# Patient Record
Sex: Female | Born: 1965 | Race: Black or African American | Hispanic: No | Marital: Married | Smoking: Never smoker
Health system: Southern US, Community
[De-identification: ages and names within clinical notes are randomized; demographics above are authoritative.]

## PROBLEM LIST (undated history)

## (undated) DIAGNOSIS — D219 Benign neoplasm of connective and other soft tissue, unspecified: Secondary | ICD-10-CM

## (undated) HISTORY — DX: Benign neoplasm of connective and other soft tissue, unspecified: D21.9

## (undated) HISTORY — DX: Morbid (severe) obesity due to excess calories: E66.01

## (undated) HISTORY — PX: OVARIAN CYST REMOVAL: SHX89

---

## 2008-08-28 ENCOUNTER — Emergency Department (HOSPITAL_COMMUNITY): Admission: EM | Admit: 2008-08-28 | Discharge: 2008-08-29 | Payer: Self-pay | Admitting: Emergency Medicine

## 2016-04-30 ENCOUNTER — Emergency Department
Admission: EM | Admit: 2016-04-30 | Discharge: 2016-04-30 | Disposition: A | Discharge status: Home / Self Care | Payer: 59 | Source: Home / Self Care | Attending: Emergency Medicine | Admitting: Emergency Medicine

## 2016-04-30 ENCOUNTER — Emergency Department (INDEPENDENT_AMBULATORY_CARE_PROVIDER_SITE_OTHER): Payer: 59

## 2016-04-30 ENCOUNTER — Encounter: Payer: Self-pay | Admitting: *Deleted

## 2016-04-30 DIAGNOSIS — S8002XA Contusion of left knee, initial encounter: Secondary | ICD-10-CM

## 2016-04-30 DIAGNOSIS — M1712 Unilateral primary osteoarthritis, left knee: Secondary | ICD-10-CM | POA: Diagnosis not present

## 2016-04-30 MED ORDER — IBUPROFEN 600 MG PO TABS
600.0000 mg | ORAL_TABLET | Freq: Once | ORAL | Status: AC
  Administered 2016-04-30: 600 mg via ORAL

## 2016-04-30 MED ORDER — HYDROCODONE-ACETAMINOPHEN 5-325 MG PO TABS: 1.0000 | ORAL_TABLET | ORAL | 0 refills | Status: DC | PRN

## 2016-04-30 NOTE — ED Triage Notes (Signed)
Pt c/o LT knee pain post fall down her stairs at home at 0530 today. No OTC meds.

## 2016-05-02 NOTE — ED Provider Notes (Signed)
[facility]    CSN: [id number] Arrival date & time: 04/30/16  1850     History   Chief Complaint Chief Complaint  Patient presents with  . Knee Injury    HPI Alexis Martin is a 51 y.o. female.   HPI Pt c/o LT knee pain post fall down her stairs at home at 0530 today.  Has not tried any OTC meds. Severe left knee pain diffusely without radiation. Pain is sharp and dull. No associated numbness or weakness. + associated swelling, medial L knee. Pain is worse with movement or when trying to weight-bear. She is able to weight-bear, but with pain. History reviewed. No pertinent past medical history.  There are no active problems to display for this patient.   Past Surgical History:  Procedure Laterality Date  . CESAREAN SECTION    . OVARIAN CYST REMOVAL      OB History    No data available       Home Medications    Prior to Admission medications   Medication Sig Start Date End Date Taking? Authorizing Provider  HYDROcodone-acetaminophen (NORCO/VICODIN) 5-325 MG tablet Take 1-2 tablets by mouth every 4 (four) hours as needed for severe pain. Take with food. 04/30/16   Jacqulyn Cane, MD    Family History History reviewed. No pertinent family history.  Social History Social History  Substance Use Topics  . Smoking status: Never Smoker  . Smokeless tobacco: Never Used  . Alcohol use No     Allergies   Patient has no known allergies.   Review of Systems Review of Systems  All other systems reviewed and are negative.    Physical Exam Triage Vital Signs ED Triage Vitals  Enc Vitals Group     BP 04/30/16 1912 (!) 162/97     Pulse Rate 04/30/16 1912 71     Resp 04/30/16 1912 16     Temp 04/30/16 1912 98.6 F (37 C)     Temp Source 04/30/16 1912 Oral     SpO2 04/30/16 1912 100 %     Weight --      Height --      Head Circumference --      Peak Flow --      Pain Score 04/30/16 1913 10     Pain Loc --      Pain Edu? --      Excl. in  [place]? --    No data found.   Updated Vital Signs BP (!) 162/97 (BP Location: Left Arm)   Pulse 71   Temp 98.6 F (37 C) (Oral)   Resp 16   LMP 04/25/2016   SpO2 100%   Visual Acuity Right Eye Distance:   Left Eye Distance:   Bilateral Distance:    Right Eye Near:   Left Eye Near:    Bilateral Near:     Physical Exam  Constitutional: She is oriented to person, place, and time. She appears well-developed and well-nourished. No distress.  Very uncomfortable from left knee pain. No cardiorespiratory distress  HENT:  Head: Normocephalic and atraumatic.  Eyes: Pupils are equal, round, and reactive to light. No scleral icterus.  Neck: Normal range of motion. Neck supple.  Cardiovascular: Normal rate and regular rhythm.   Pulmonary/Chest: Effort normal.  Abdominal: She exhibits no distension.  Musculoskeletal:       Left knee: She exhibits decreased range of motion, swelling (Mild, medial aspect. She is most tender here) and bony tenderness. She exhibits no  effusion, no laceration, normal meniscus and no MCL laxity. Tenderness found. Medial joint line tenderness noted. No lateral joint line and no patellar tendon tenderness noted.       Left lower leg: Normal. She exhibits no tenderness, no swelling and no edema.  Neurovascular distally left lower extremity is intact  Neurological: She is alert and oriented to person, place, and time. No cranial nerve deficit.  Skin: Skin is warm and dry.  Psychiatric: She has a normal mood and affect. Her behavior is normal.  Vitals reviewed.     UC Treatments / Results  Labs (all labs ordered are listed, but only abnormal results are displayed) Labs Reviewed - No data to display  EKG  EKG Interpretation None       Radiology Dg Knee Complete 4 Views Left  Result Date: 04/30/2016 CLINICAL DATA:  Fall coming down stairs this morning injuring medial left knee. EXAM: LEFT KNEE - COMPLETE 4+ VIEW COMPARISON:  None. FINDINGS: Mild  tricompartmental osteoarthritic change. No evidence of acute fracture or dislocation. No significant joint effusion. IMPRESSION: No acute fracture. Mild tricompartmental osteoarthritis. Electronically Signed   By: Marin Olp M.D.   On: 04/30/2016 19:43    Procedures Procedures (including critical care time)  Medications Ordered in UC Medications  ibuprofen (ADVIL,MOTRIN) tablet 600 mg (600 mg Oral Given 04/30/16 1928)     Initial Impression / Assessment and Plan / UC Course  I have reviewed the triage vital signs and the nursing notes.  Pertinent labs & imaging results that were available during my care of the patient were reviewed by me and considered in my medical decision making (see chart for details).      Final Clinical Impressions(s) / UC Diagnoses   Final diagnoses:  Contusion of left knee, initial encounter  Reviewed with patient, left knee x-ray shows no fracture or dislocation or any significant joint effusion. Explained there is some mild osteoarthritis left knee.  Treatment options discussed, as well as risks, benefits, alternatives. Patient voiced understanding and agreement with the following plans: Encourage rest, ice, compression with ACE bandage, and elevation of injured body part. We applied Ace wrap left knee. Knee immobilizer applied. Here in urgent care, gave ibuprofen 600 mg by mouth stat. This helped ease pain a little bit. May take ibuprofen OTC at home 600 mg every 6 hours with food when necessary mild to moderate pain. She has no history of problems with narcotic pain medicine, so I prescribed small amount of Vicodin to use as needed for severe pain. Precautions discussed.  New Prescriptions Discharge Medication List as of 04/30/2016  8:13 PM    START taking these medications   Details  HYDROcodone-acetaminophen (NORCO/VICODIN) 5-325 MG tablet Take 1-2 tablets by mouth every 4 (four) hours as needed for severe pain. Take with food., Starting Tue  04/30/2016, Print      Follow-up with ortho in 5-7 days if not improving, or sooner if symptoms become worse. Precautions discussed. Red flags discussed. Questions invited and answered. Patient voiced understanding and agreement.    Jacqulyn Cane, MD 05/02/16 1254

## 2018-01-07 IMAGING — DX DG KNEE COMPLETE 4+V*L*
4 series · 4 of 4 positions shown · non-contrast
Comparison: None.

CLINICAL DATA: Fall coming down stairs this morning injuring medial
left knee.

EXAM:
LEFT KNEE - COMPLETE 4+ VIEW

[knee ap]
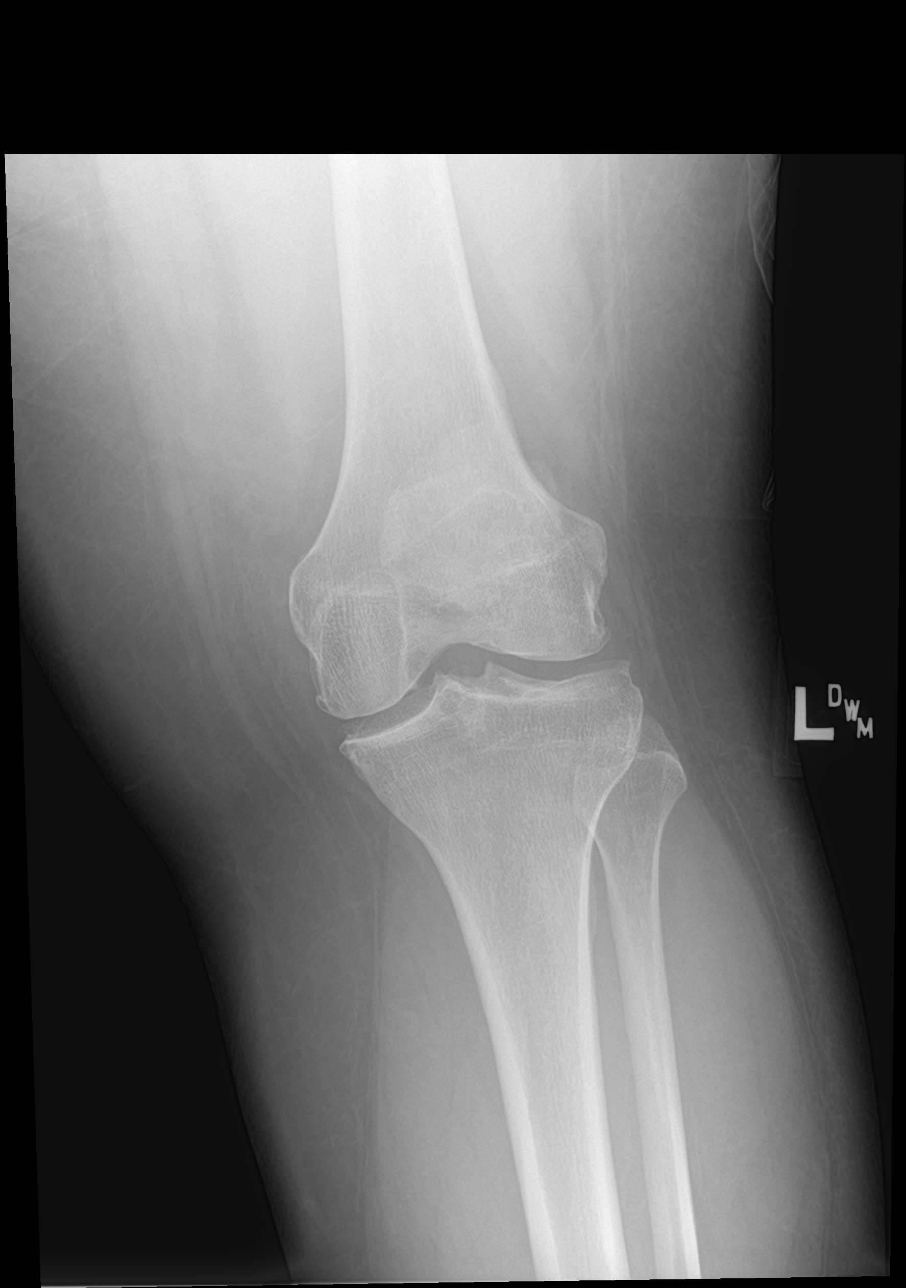

[knee lat]
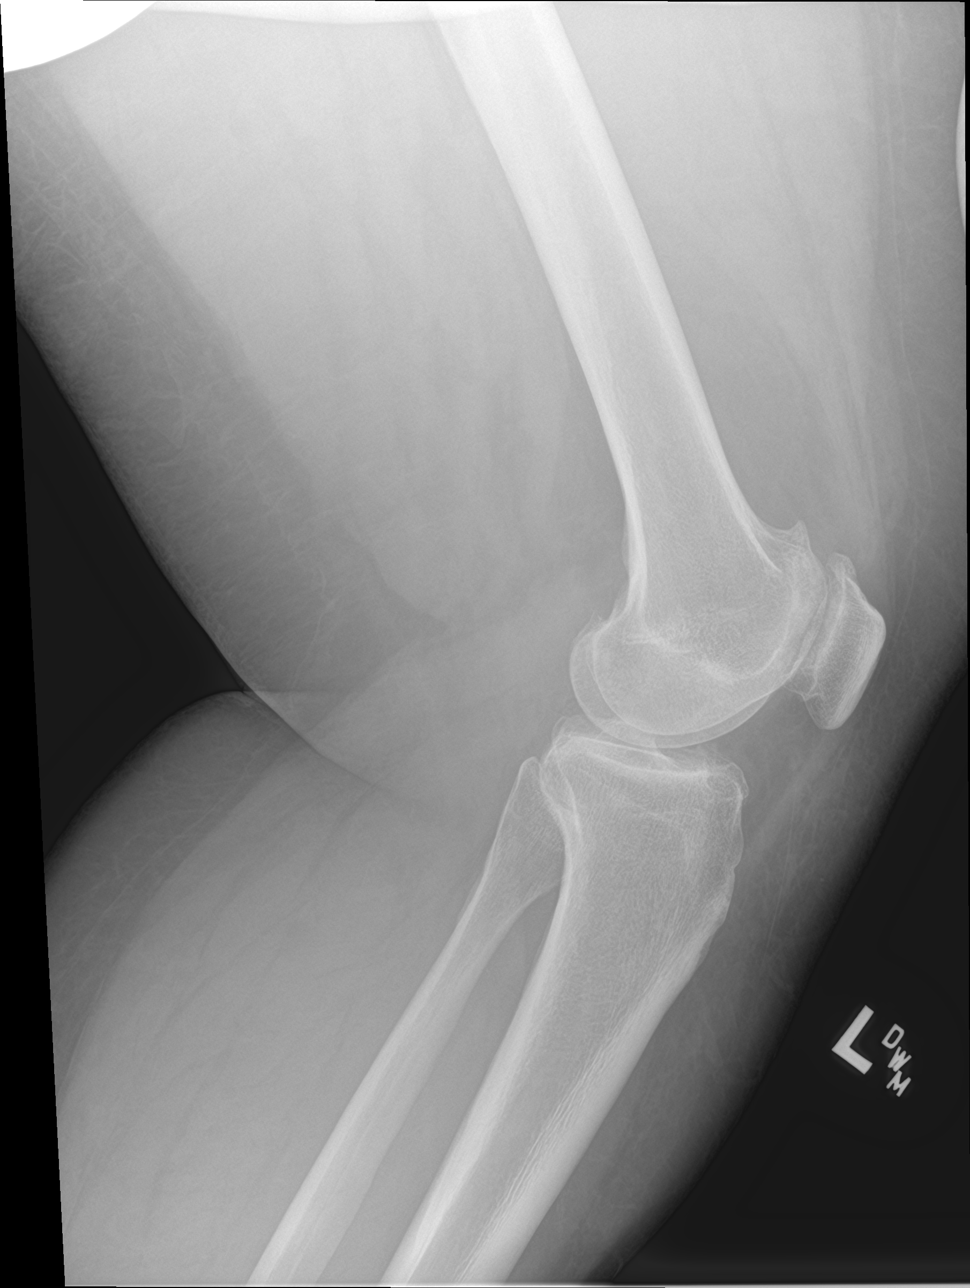

[knee obl (1 of 2)]
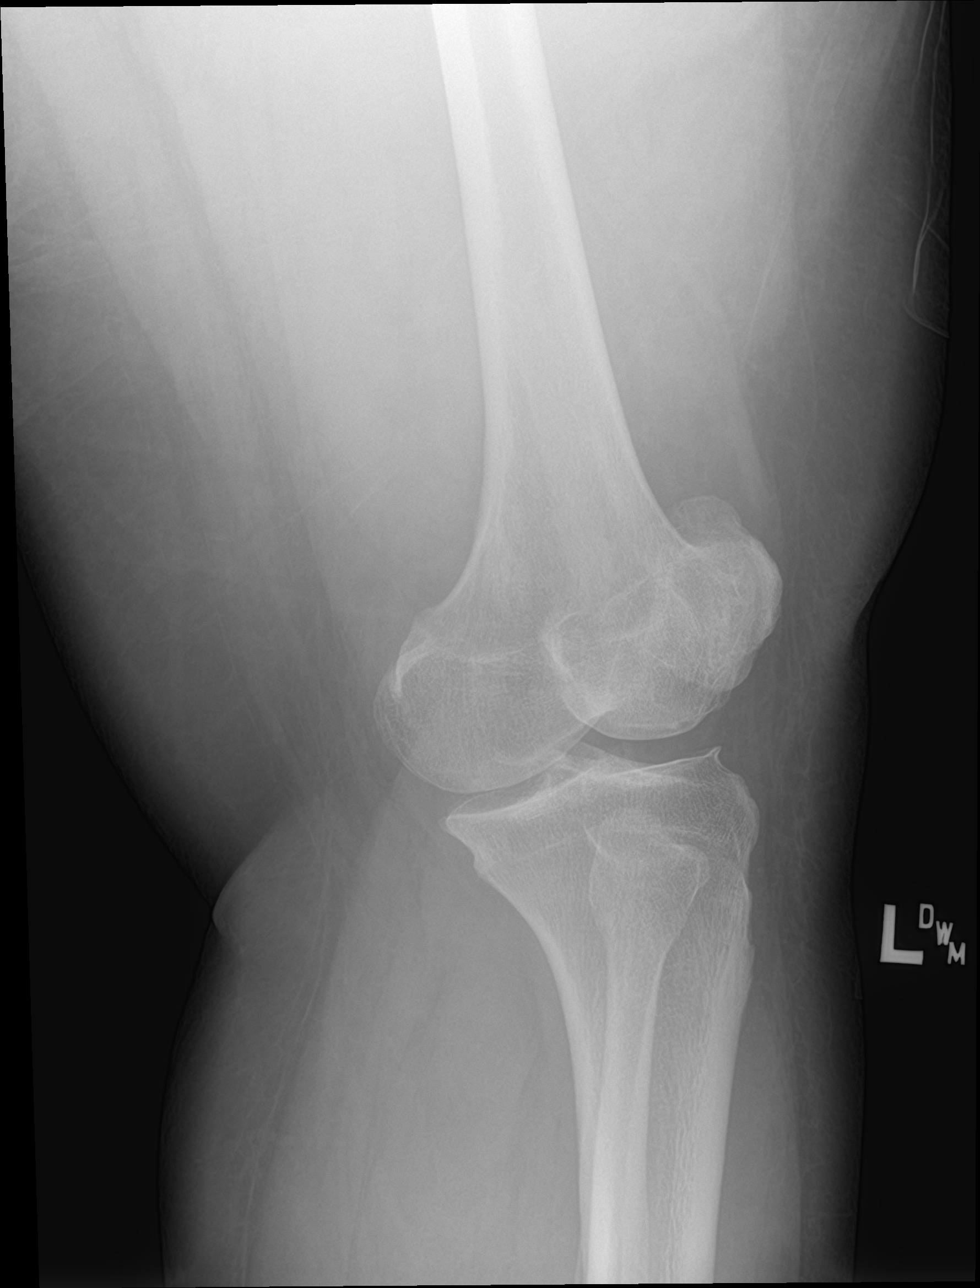

[knee obl (2 of 2)]
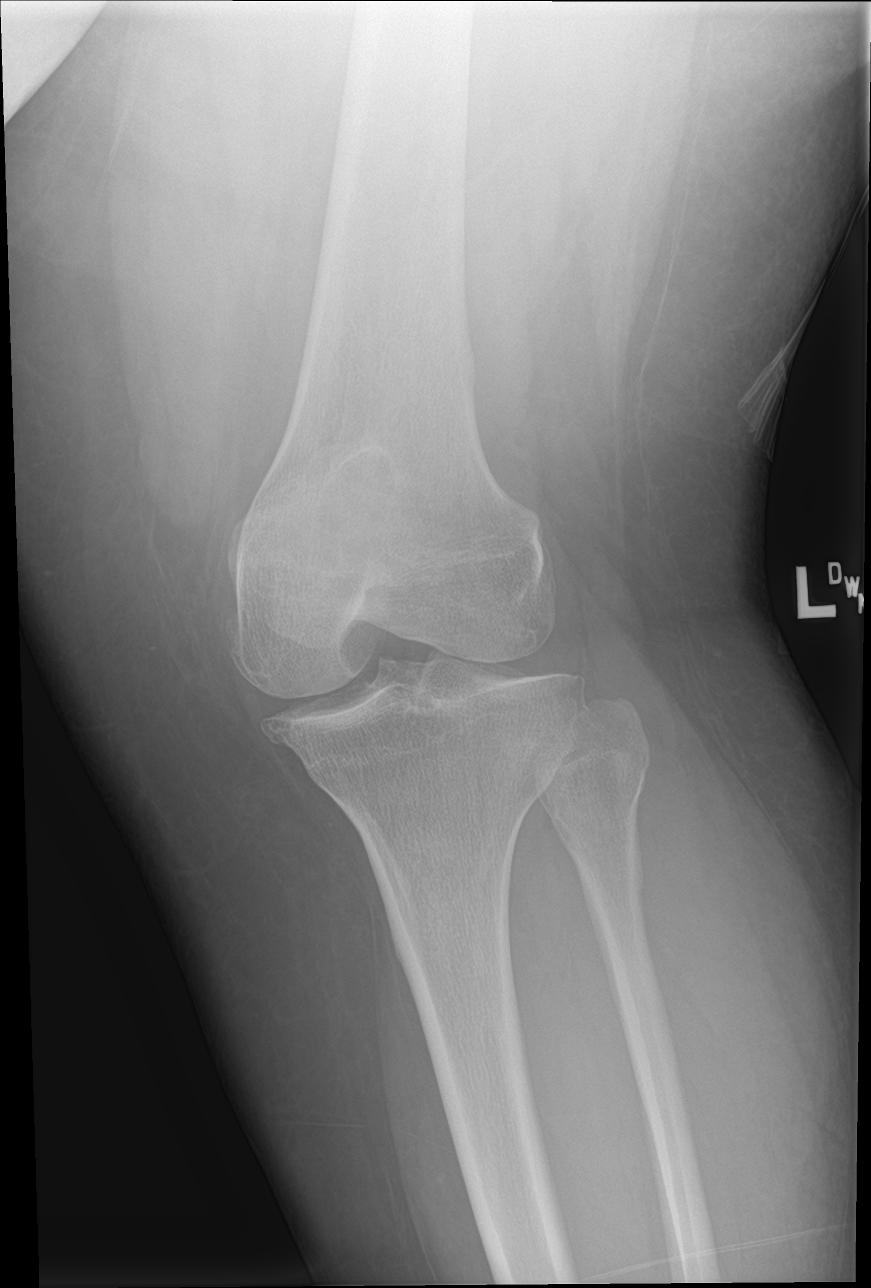

[4 of 4 positions shown; findings below may reference images not displayed]

FINDINGS: Mild tricompartmental osteoarthritic change. No evidence of acute
fracture or dislocation. No significant joint effusion.
IMPRESSION: No acute fracture.

Mild tricompartmental osteoarthritis.

## 2018-07-29 ENCOUNTER — Other Ambulatory Visit: Payer: Self-pay | Admitting: Neurological Surgery

## 2018-07-29 DIAGNOSIS — M545 Low back pain, unspecified: Secondary | ICD-10-CM

## 2018-08-22 ENCOUNTER — Other Ambulatory Visit: Payer: 59

## 2018-12-22 ENCOUNTER — Telehealth: Payer: Self-pay | Admitting: *Deleted

## 2018-12-22 NOTE — Telephone Encounter (Signed)
Returned call from 10:27 AM. Patient wants to schedule an appointment with Dr. Harolyn Rutherford. Left patient a message with contact information to the offices where Dr. Harolyn Rutherford is for the month of December.

## 2019-03-08 ENCOUNTER — Ambulatory Visit (INDEPENDENT_AMBULATORY_CARE_PROVIDER_SITE_OTHER): Payer: 59 | Admitting: Obstetrics & Gynecology

## 2019-03-08 ENCOUNTER — Encounter: Payer: Self-pay | Admitting: Obstetrics & Gynecology

## 2019-03-08 ENCOUNTER — Other Ambulatory Visit: Payer: Self-pay

## 2019-03-08 VITALS — BP 166/89 | HR 83 | Ht 59.0 in | Wt 226.0 lb

## 2019-03-08 DIAGNOSIS — Z01419 Encounter for gynecological examination (general) (routine) without abnormal findings: Secondary | ICD-10-CM

## 2019-03-08 DIAGNOSIS — D219 Benign neoplasm of connective and other soft tissue, unspecified: Secondary | ICD-10-CM

## 2019-03-08 DIAGNOSIS — R102 Pelvic and perineal pain unspecified side: Secondary | ICD-10-CM

## 2019-03-08 DIAGNOSIS — G8929 Other chronic pain: Secondary | ICD-10-CM

## 2019-03-08 DIAGNOSIS — R32 Unspecified urinary incontinence: Secondary | ICD-10-CM

## 2019-03-08 DIAGNOSIS — R350 Frequency of micturition: Secondary | ICD-10-CM

## 2019-03-08 NOTE — Progress Notes (Signed)
GYNECOLOGY ANNUAL PREVENTATIVE CARE ENCOUNTER NOTE  History:     Alexis Martin is a 54 y.o. G11P2002 female here to establish care and for a routine annual gynecologic exam.  Current complaints: history of fibroids seen on CT scan last year, was reevaluation. Has occasional pelvic pain especially on her right side.  Also long history of urinary frequency and incontinence when she laughs or sneezes, also wants this managed.   Denies abnormal vaginal bleeding, discharge, pain during urination, problems with intercourse or other gynecologic concerns.    Obstetric/Gynecologic History Patient's last menstrual period was 01/05/2019 (approximate). Contraception: none  History of two term cesarean sections.  Last Pap: 03/12/2018. Results were: normal with negative HPV Last mammogram: 03/2018. Results were: normal   Past Medical History:  Diagnosis Date  . Obesity, morbid, BMI 40.0-49.9 ([place])     Past Surgical History:  Procedure Laterality Date  . CESAREAN SECTION     X 2  . OVARIAN CYST REMOVAL      Current Outpatient Medications on File Prior to Visit  Medication Sig Dispense Refill  . calcium-vitamin D (OSCAL WITH D) 500-200 MG-UNIT TABS tablet Take by mouth.    Marland Kitchen MAGNESIUM PO Take by mouth.    Marland Kitchen VITAMIN E PO Take by mouth.    . zinc gluconate 50 MG tablet Take 50 mg by mouth daily.     No current facility-administered medications on file prior to visit.    Allergies  Allergen Reactions  . Cefuroxime Axetil Itching  . Cinnamon   . Peanut-Containing Drug Products     Social History:  reports that she has never smoked. She has never used smokeless tobacco. She reports that she does not drink alcohol or use drugs.  History reviewed. No pertinent family history.  The following portions of the patient's history were reviewed and updated as appropriate: allergies, current medications, past family history, past medical history, past social history, past surgical history and  problem list.  Review of Systems Pertinent items noted in HPI and remainder of comprehensive ROS otherwise negative.  Physical Exam:  BP (!) 166/89   Pulse 83   Ht 4\' 11"  (1.499 m)   Wt 226 lb (102.5 kg)   LMP 01/05/2019 (Approximate)   BMI 45.65 kg/m  CONSTITUTIONAL: Well-developed, well-nourished female in no acute distress.  HENT:  Normocephalic, atraumatic, External right and left ear normal. Oropharynx is clear and moist EYES: Conjunctivae and EOM are normal. Pupils are equal, round, and reactive to light. No scleral icterus.  NECK: Normal range of motion, supple, no masses.  Normal thyroid.  SKIN: Skin is warm and dry. No rash noted. Not diaphoretic. No erythema. No pallor. MUSCULOSKELETAL: Normal range of motion. No tenderness.  No cyanosis, clubbing, or edema.  2+ distal pulses. NEUROLOGIC: Alert and oriented to person, place, and time. Normal reflexes, muscle tone coordination.  PSYCHIATRIC: Normal mood and affect. Normal behavior. Normal judgment and thought content. CARDIOVASCULAR: Normal heart rate noted, regular rhythm RESPIRATORY: Clear to auscultation bilaterally. Effort and breath sounds normal, no problems with respiration noted. BREASTS: Symmetric in size. No masses, tenderness, skin changes, nipple drainage, or lymphadenopathy bilaterally. ABDOMEN: Soft, no distention noted.  No tenderness, rebound or guarding. Well healed double Pfannenstiel incisions.  PELVIC: Normal appearing external genitalia and urethral meatus; normal appearing vaginal mucosa and cervix.  No abnormal discharge noted. Minimal cystocele noted during Valsalva.  Enlarged uterine size, small fibroid palpated posteriorly.  No other palpable masses, no uterine or adnexal tenderness.  Assessment and Plan:      1. Urinary incontinence in female 2. Urinary frequency Will do urine culture but will refer to Urogynecology for further evaluation and management.  -  Urine Culture - Ambulatory referral to  Urogynecology  3. Fibroids 4. Chronic female pelvic pain Will get pelvic ultrasound for further evaluation.  NSAIDs recommended for pain as needed. - US PELVIC COMPLETE WITH TRANSVAGINAL; Future  5. Well woman exam Up-to-date on pap and mammogram Routine preventative health maintenance measures emphasized. Please refer to After Visit Summary for other counseling recommendations.      Verita Schneiders, MD, [place] for [employer], [place]

## 2019-03-08 NOTE — Patient Instructions (Signed)

## 2019-03-09 LAB — URINE CULTURE
MICRO NUMBER:: 10173780
SPECIMEN QUALITY:: ADEQUATE

## 2019-03-25 ENCOUNTER — Other Ambulatory Visit: Payer: 59

## 2021-09-26 ENCOUNTER — Encounter: Payer: Self-pay | Admitting: Emergency Medicine

## 2021-09-26 ENCOUNTER — Ambulatory Visit
Admission: EM | Admit: 2021-09-26 | Discharge: 2021-09-26 | Disposition: A | Discharge status: Home / Self Care | Payer: 59 | Attending: Family Medicine | Admitting: Family Medicine

## 2021-09-26 DIAGNOSIS — R519 Headache, unspecified: Secondary | ICD-10-CM | POA: Diagnosis not present

## 2021-09-26 DIAGNOSIS — M62838 Other muscle spasm: Secondary | ICD-10-CM

## 2021-09-26 MED ORDER — METHOCARBAMOL 500 MG PO TABS: 500.0000 mg | ORAL_TABLET | Freq: Three times a day (TID) | ORAL | 0 refills | Status: DC | PRN

## 2021-09-26 MED ORDER — METOCLOPRAMIDE HCL 5 MG/ML IJ SOLN
10.0000 mg | INTRAMUSCULAR | Status: AC
  Administered 2021-09-26: 10 mg via INTRAMUSCULAR

## 2021-09-26 MED ORDER — KETOROLAC TROMETHAMINE 60 MG/2ML IM SOLN
60.0000 mg | Freq: Once | INTRAMUSCULAR | Status: AC
  Administered 2021-09-26: 60 mg via INTRAMUSCULAR
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
## 2021-09-26 NOTE — Discharge Instructions (Addendum)
Advised patient may take Robaxin daily or as needed for accompanying muscle spasms of bilateral posterior neck/trapezius/rhomboid muscles. Encouraged patient to increase daily water intake while taking this medication. Advised patient if symptoms worsen and/or unresolved please follow-up with PCP or here for further evaluation.

## 2021-09-27 ENCOUNTER — Telehealth: Payer: Self-pay

## 2021-09-27 NOTE — Telephone Encounter (Signed)
Attempted to f/u with pt after yesterday's visit to [facility]. No answer; VM full.

## 2022-06-20 ENCOUNTER — Encounter: Payer: 59 | Admitting: Obstetrics & Gynecology

## 2022-07-17 ENCOUNTER — Other Ambulatory Visit (HOSPITAL_COMMUNITY)
Admission: RE | Admit: 2022-07-17 | Discharge: 2022-07-17 | Disposition: A | Discharge status: Home / Self Care | Payer: 59 | Source: Ambulatory Visit | Attending: Obstetrics and Gynecology | Admitting: Obstetrics and Gynecology

## 2022-07-17 ENCOUNTER — Encounter: Payer: Self-pay | Admitting: Obstetrics and Gynecology

## 2022-07-17 ENCOUNTER — Other Ambulatory Visit (HOSPITAL_COMMUNITY): Admission: RE | Admit: 2022-07-17 | Payer: 59 | Source: Ambulatory Visit

## 2022-07-17 ENCOUNTER — Ambulatory Visit (INDEPENDENT_AMBULATORY_CARE_PROVIDER_SITE_OTHER): Payer: 59 | Admitting: Obstetrics and Gynecology

## 2022-07-17 VITALS — BP 170/90 | HR 56 | Resp 16 | Ht 59.0 in | Wt 231.0 lb

## 2022-07-17 DIAGNOSIS — N950 Postmenopausal bleeding: Secondary | ICD-10-CM | POA: Diagnosis not present

## 2022-07-17 DIAGNOSIS — R030 Elevated blood-pressure reading, without diagnosis of hypertension: Secondary | ICD-10-CM

## 2022-07-17 DIAGNOSIS — Z6841 Body Mass Index (BMI) 40.0 and over, adult: Secondary | ICD-10-CM

## 2022-07-17 DIAGNOSIS — Z124 Encounter for screening for malignant neoplasm of cervix: Secondary | ICD-10-CM

## 2022-07-17 DIAGNOSIS — L989 Disorder of the skin and subcutaneous tissue, unspecified: Secondary | ICD-10-CM

## 2022-07-17 NOTE — Progress Notes (Signed)
GYNECOLOGY OFFICE VISIT NOTE  History:   Alexis Martin is a 57 y.o. [id number] here today for PMB. Has been without a period for about 3 years. Has known history of fibroids. Had one episode of PMB that was around [place] day - bled x5d like a period. None since.   Also notes patch of skin on her hand (left). Feels like allergy related but would like testing/derm referral.   BP only recently elevated - PCP wants to watch it. Feels like she was nervous for this appointment.   Also trying to lose weight. Started walking and hoping to change diet. Would like blood work.      Past Medical History:  Diagnosis Date   Fibroid    Obesity, morbid, BMI 40.0-49.9 ([facility])     Past Surgical History:  Procedure Laterality Date   CESAREAN SECTION     X 2   OVARIAN CYST REMOVAL      The following portions of the patient's history were reviewed and updated as appropriate: allergies, current medications, past family history, past medical history, past social history, past surgical history and problem list.   Health Maintenance:   >3 years ago since last pap.   Normal mammogram on 11/12/2021.   Review of Systems:  Pertinent items noted in HPI and remainder of comprehensive ROS otherwise negative.  Physical Exam:  BP (!) 169/101   Pulse 73   Resp 16   Ht 4\' 11"  (1.499 m)   Wt 231 lb (104.8 kg)   LMP 01/05/2019 (Approximate)   BMI 46.66 kg/m  CONSTITUTIONAL: Well-developed, well-nourished female in no acute distress.  HEENT:  Normocephalic, atraumatic. External right and left ear normal. No scleral icterus.  NECK: Normal range of motion, supple, no masses noted on observation SKIN: No rash noted. Not diaphoretic. No erythema. No pallor. MUSCULOSKELETAL: Normal range of motion. No edema noted. NEUROLOGIC: Alert and oriented to person, place, and time. Normal muscle tone coordination. No cranial nerve deficit noted. PSYCHIATRIC: Normal mood and affect. Normal behavior. Normal  judgment and thought content.  CARDIOVASCULAR: Normal heart rate noted RESPIRATORY: Effort and breath sounds normal, no problems with respiration noted ABDOMEN: No masses noted. No other overt distention noted.    PELVIC: Normal appearing external genitalia; normal urethral meatus; normal appearing vaginal mucosa and cervix.  No abnormal discharge noted.  Normal uterine size, no other palpable masses, no uterine or adnexal tenderness. Performed in the presence of a chaperone  Labs and Imaging No results found for this or any previous visit (from the past 168 hour(s)). No results found.     GYNECOLOGY OFFICE PROCEDURE NOTE   Alexis Martin is a 57 y.o. [id number] here for endometrial biopsy for PMB.    ENDOMETRIAL BIOPSY     The indications for endometrial biopsy were reviewed.   Risks of the biopsy including cramping, bleeding, infection, uterine perforation, inadequate specimen and need for additional procedures were discussed. Offered alternative of hysteroscopy, dilation and curettage in OR. The patient states she understands the R/B/I/A and agrees to undergo procedure today. Urine pregnancy test was Not indicated. Consent was signed. Time out was performed.    Patient was positioned in dorsal lithotomy position. A vaginal speculum was placed.  The cervix was visualized and was prepped with Betadine.  A single-toothed tenaculum was placed on the anterior lip of the cervix to stabilize it. The 3 mm pipelle was easily introduced into the endometrial cavity without difficulty to a depth of 10 cm, and a Scant  amount of tissue was obtained after two passes and sent to pathology. The instruments were removed from the patient's vagina. Minimal bleeding from the cervix was noted. The patient tolerated the procedure well.    Assessment and Plan:    Alexis Martin was seen today for post menopausal bleeding.  Diagnoses and all orders for this visit:  PMB (postmenopausal bleeding) EMB done today with  [place] to follow up on fibroids.  -     Surgical pathology( [place]/ [facility]) -     Cytology - PAP( [place]) -     US PELVIC COMPLETE WITH TRANSVAGINAL; Future  Cervical cancer screening Pap with HPV done.  -     Cytology - PAP( [place])  Skin lesion -     Ambulatory referral to Dermatology  BMI 45.0-49.9, adult (HCC) -     TSH Rfx on Abnormal to Free T4 -     Leptin, Serum -     HgB A1c -     Lipid Profile -     Comp Met (CMET) -     CBC  Elevated blood pressure reading - Rechecked today. Encouraged f/u with PCP.    Routine preventative health maintenance measures emphasized. Please refer to After Visit Summary for other counseling recommendations.   Return in about 1 month (around 08/17/2022) for Follow up.  Alexis Hock, MD, FACOG Obstetrician & Gynecologist, [facility] for [facility], [facility]

## 2022-07-19 LAB — LIPID PANEL
Chol/HDL Ratio: 3 ratio (ref 0.0–4.4)
Cholesterol, Total: 203 mg/dL — ABNORMAL HIGH (ref 100–199)
HDL: 67 mg/dL (ref >39)
LDL Chol Calc (NIH): 125 mg/dL — ABNORMAL HIGH (ref 0–99)
Triglycerides: 61 mg/dL (ref 0–149)
VLDL Cholesterol Cal: 11 mg/dL (ref 5–40)

## 2022-07-19 LAB — COMPREHENSIVE METABOLIC PANEL
ALT: 9 IU/L (ref 0–32)
AST: 15 IU/L (ref 0–40)
Albumin: 4.2 g/dL (ref 3.8–4.9)
Alkaline Phosphatase: 102 IU/L (ref 44–121)
BUN/Creatinine Ratio: 12 (ref 9–23)
BUN: 9 mg/dL (ref 6–24)
Bilirubin Total: 0.5 mg/dL (ref 0.0–1.2)
CO2: 23 mmol/L (ref 20–29)
Calcium: 9.7 mg/dL (ref 8.7–10.2)
Chloride: 102 mmol/L (ref 96–106)
Creatinine, Ser: 0.74 mg/dL (ref 0.57–1.00)
Globulin, Total: 3.3 g/dL (ref 1.5–4.5)
Glucose: 101 mg/dL — ABNORMAL HIGH (ref 70–99)
Potassium: 4.3 mmol/L (ref 3.5–5.2)
Sodium: 140 mmol/L (ref 134–144)
Total Protein: 7.5 g/dL (ref 6.0–8.5)
eGFR: 96 mL/min/{1.73_m2} (ref >59)

## 2022-07-19 LAB — CBC
Hematocrit: 33.7 % — ABNORMAL LOW (ref 34.0–46.6)
Hemoglobin: 10.6 g/dL — ABNORMAL LOW (ref 11.1–15.9)
MCH: 30.3 pg (ref 26.6–33.0)
MCHC: 31.5 g/dL (ref 31.5–35.7)
MCV: 96 fL (ref 79–97)
Platelets: 389 10*3/uL (ref 150–450)
RBC: 3.5 x10E6/uL — ABNORMAL LOW (ref 3.77–5.28)
RDW: 11.6 % — ABNORMAL LOW (ref 11.7–15.4)
WBC: 8 10*3/uL (ref 3.4–10.8)

## 2022-07-19 LAB — HEMOGLOBIN A1C
Est. average glucose Bld gHb Est-mCnc: 103 mg/dL
Hgb A1c MFr Bld: 5.2 % (ref 4.8–5.6)

## 2022-07-19 LAB — LEPTIN, SERUM: Leptin, Serum: 67.4 ng/mL

## 2022-07-19 LAB — SURGICAL PATHOLOGY

## 2022-07-19 LAB — TSH RFX ON ABNORMAL TO FREE T4: TSH: 0.639 u[IU]/mL (ref 0.450–4.500)

## 2022-07-23 LAB — CYTOLOGY - PAP
Comment: NEGATIVE
Diagnosis: NEGATIVE
High risk HPV: NEGATIVE

## 2022-08-05 ENCOUNTER — Ambulatory Visit (INDEPENDENT_AMBULATORY_CARE_PROVIDER_SITE_OTHER): Payer: 59

## 2022-08-05 DIAGNOSIS — N950 Postmenopausal bleeding: Secondary | ICD-10-CM

## 2022-08-27 NOTE — Progress Notes (Unsigned)
GYNECOLOGY OFFICE VISIT NOTE  History:   Alexis Martin is a 57 y.o. [id number] here today for follow up from PMB.   US showed EL 5mm. EMB was negative for hyperplasia.  No PMB since May.    The following portions of the patient's history were reviewed and updated as appropriate: allergies, current medications, past family history, past medical history, past social history, past surgical history and problem list.   Health Maintenance:   Diagnosis  Date Value Ref Range Status  07/17/2022   Final   - Negative for intraepithelial lesion or malignancy (NILM)     Normal mammogram on 11/12/2021.   Review of Systems:  Pertinent items noted in HPI and remainder of comprehensive ROS otherwise negative.  Physical Exam:  BP (!) 201/96   Pulse (!) 55   Ht 4\' 11"  (1.499 m)   Wt 232 lb (105.2 kg)   LMP 01/05/2019 (Approximate)   BMI 46.86 kg/m  CONSTITUTIONAL: Well-developed, well-nourished female in no acute distress.  HEENT:  Normocephalic, atraumatic. External right and left ear normal. No scleral icterus.  NECK: Normal range of motion, supple, no masses noted on observation SKIN: No rash noted. Not diaphoretic. No erythema. No pallor. MUSCULOSKELETAL: Normal range of motion. No edema noted. NEUROLOGIC: Alert and oriented to person, place, and time. Normal muscle tone coordination. No cranial nerve deficit noted. PSYCHIATRIC: Normal mood and affect. Normal behavior. Normal judgment and thought content.  Labs and Imaging No results found for this or any previous visit (from the past 168 hour(s)). US PELVIC COMPLETE WITH TRANSVAGINAL  Result Date: 08/07/2022 CLINICAL DATA:  Initial evaluation for postmenopausal bleeding. EXAM: TRANSABDOMINAL AND TRANSVAGINAL ULTRASOUND OF PELVIS TECHNIQUE: Both transabdominal and transvaginal ultrasound examinations of the pelvis were performed. Transabdominal technique was performed for global imaging of the pelvis including uterus, ovaries,  adnexal regions, and pelvic cul-de-sac. It was necessary to proceed with endovaginal exam following the transabdominal exam to visualize the endometrium. COMPARISON:  None Available. FINDINGS: Uterus Measurements: 9.9 x 5.9 x 6.6 cm = volume: 201.4 mL. Uterus is retroverted with several fibroids seen as follows; 1. 2.8 x 1.8 x 2.5 cm subserosal fibroid present at the right posterior uterine body. 2. 3.5 x 3.0 x 3.7 cm intramural fibroid present at the right posterior uterine body. 3. 2.7 x 1.4 x 2.3 cm intramural fibroid present at the left uterine body. 4. 2.2 x 1.5 x 2.4 cm intramural fibroid present at the left uterine fundus. 1.7 x 1.6 x 1.8 cm complex nabothian cyst noted at the cervix. Endometrium Thickness: 5.3 mm.  No focal abnormality visualized. Right ovary Measurements: 2.6 x 1.7 x 2.0 cm = volume: 4.8 mL. Normal appearance/no adnexal mass. Left ovary Measurements: 2.9 x 2.0 x 2.0 cm = volume: 6.1 mL. Normal appearance/no adnexal mass. Other findings No abnormal free fluid. IMPRESSION: 1. Endometrial stripe borderline thickened measuring 5.3 mm. In the setting of post-menopausal bleeding, endometrial sampling is indicated to exclude carcinoma. If results are benign, sonohysterogram should be considered for focal lesion work-up. (Ref: Radiological Reasoning: Algorithmic Workup of Abnormal Vaginal Bleeding with Endovaginal Sonography and Sonohysterography. AJR 2008; 308:M57-84). 2. Underlying fibroid uterus as detailed above. 3. Normal sonographic appearance of the ovaries. No adnexal mass or free fluid. Electronically Signed   By: Alexis Martin M.D.   On: 08/07/2022 03:46    Assessment and Plan:  [name] was seen today for follow-up.  Diagnoses and all orders for this visit:  PMB (postmenopausal bleeding) Reviewed US findings with her  in detail. Reviewed pathology.  She will let us know if she has any new onset bleeding.   Primary hypertension She saw her PCP who prescribed medication -  she has not yet started it. She will start it today.    No orders of the defined types were placed in this encounter.    Routine preventative health maintenance measures emphasized. Please refer to After Visit Summary for other counseling recommendations.   Return if symptoms worsen or fail to improve.  Alexis Hock, MD, FACOG Obstetrician & Gynecologist, [facility] for [facility], [facility]

## 2022-08-29 ENCOUNTER — Encounter: Payer: Self-pay | Admitting: Obstetrics and Gynecology

## 2022-08-29 ENCOUNTER — Ambulatory Visit: Payer: 59 | Admitting: Obstetrics and Gynecology

## 2022-08-29 VITALS — BP 201/96 | HR 55 | Ht 59.0 in | Wt 232.0 lb

## 2022-08-29 DIAGNOSIS — N950 Postmenopausal bleeding: Secondary | ICD-10-CM | POA: Diagnosis not present

## 2022-08-29 DIAGNOSIS — I10 Essential (primary) hypertension: Secondary | ICD-10-CM | POA: Insufficient documentation

## 2022-12-02 ENCOUNTER — Ambulatory Visit: Payer: 59 | Admitting: Dermatology

## 2023-05-27 ENCOUNTER — Ambulatory Visit

## 2023-05-27 ENCOUNTER — Ambulatory Visit
Admission: EM | Admit: 2023-05-27 | Discharge: 2023-05-27 | Disposition: A | Discharge status: Home / Self Care | Attending: Family Medicine | Admitting: Family Medicine

## 2023-05-27 DIAGNOSIS — M79642 Pain in left hand: Secondary | ICD-10-CM

## 2023-05-27 DIAGNOSIS — R202 Paresthesia of skin: Secondary | ICD-10-CM | POA: Diagnosis not present

## 2023-05-27 MED ORDER — CELECOXIB 200 MG PO CAPS: 200.0000 mg | ORAL_CAPSULE | Freq: Every day | ORAL | 0 refills | Status: AC

## 2023-05-27 MED ORDER — PREDNISONE 20 MG PO TABS: ORAL_TABLET | ORAL | 0 refills | Status: AC

## 2023-05-27 NOTE — ED Triage Notes (Signed)
 Pt presents to [facility] with co left hand pain since last Wednesday. And she endorses tingling and chest burring sensation hat comes and goes since Saturday.

## 2023-05-27 NOTE — ED Provider Notes (Signed)
 [facility]    CSN: [id number] Arrival date & time: 05/27/23  1117      History   Chief Complaint Chief Complaint  Patient presents with   Hand Problem    HPI Alexis Martin is a 58 y.o. female.   HPI  Past Medical History:  Diagnosis Date   Fibroid    Obesity, morbid, BMI 40.0-49.9 (HCC)     Patient Active Problem List   Diagnosis Date Noted   Primary hypertension 08/29/2022    Past Surgical History:  Procedure Laterality Date   CESAREAN SECTION     X 2   OVARIAN CYST REMOVAL      OB History     Gravida  2   Para  2   Term  2   Preterm      AB      Living  2      SAB      IAB      Ectopic      Multiple      Live Births               Home Medications    Prior to Admission medications   Medication Sig Start Date End Date Taking? Authorizing Provider  celecoxib (CELEBREX) 200 MG capsule Take 1 capsule (200 mg total) by mouth daily for 15 days. 05/27/23 06/11/23 Yes Leonides Ramp, FNP  predniSONE (DELTASONE) 20 MG tablet Take 3 tabs PO daily x 5 days. 05/27/23  Yes Leonides Ramp, FNP  calcium-vitamin D (OSCAL WITH D) 500-200 MG-UNIT TABS tablet Take by mouth.    [provider]  ferrous sulfate 324 MG TBEC Take 324 mg by mouth.    [provider]  MAGNESIUM PO Take by mouth.    [provider]  zinc gluconate 50 MG tablet Take 50 mg by mouth daily.    [provider]    Family History Family History  Problem Relation Age of Onset   Asthma Sister    Asthma Brother    Cancer Maternal Grandmother     Social History Social History   Tobacco Use   Smoking status: Never   Smokeless tobacco: Never  Vaping Use   Vaping status: Never Used  Substance Use Topics   Alcohol use: No   Drug use: No     Allergies   Cefuroxime axetil, Cinnamon, and Peanut-containing drug products   Review of Systems Review of Systems  Cardiovascular:  Positive for chest pain.  Neurological:         Left hand tingling     Physical Exam Triage Vital Signs ED Triage Vitals  Encounter Vitals Group     BP      Systolic BP Percentile      Diastolic BP Percentile      Pulse      Resp      Temp      Temp src      SpO2      Weight      Height      Head Circumference      Peak Flow      Pain Score      Pain Loc      Pain Education      Exclude from Growth Chart    No data found.  Updated Vital Signs BP (!) 147/84   Pulse 79   Temp 98.9 F (37.2 C)   Resp 16   LMP 01/05/2019 (Approximate)  SpO2 98%   Visual Acuity Right Eye Distance:   Left Eye Distance:   Bilateral Distance:    Right Eye Near:   Left Eye Near:    Bilateral Near:     Physical Exam Vitals and nursing note reviewed.  Constitutional:      Appearance: Normal appearance. She is obese.  HENT:     Head: Normocephalic and atraumatic.     Mouth/Throat:     Mouth: Mucous membranes are moist.     Pharynx: Oropharynx is clear.  Eyes:     Extraocular Movements: Extraocular movements intact.     Conjunctiva/sclera: Conjunctivae normal.     Pupils: Pupils are equal, round, and reactive to light.  Cardiovascular:     Rate and Rhythm: Normal rate and regular rhythm.     Pulses: Normal pulses.     Heart sounds: Normal heart sounds.  Pulmonary:     Effort: Pulmonary effort is normal.     Breath sounds: Normal breath sounds. No wheezing, rhonchi or rales.  Musculoskeletal:        General: Normal range of motion.     Cervical back: Normal range of motion and neck supple.     Comments: Left hand: Mild soft tissue swelling noted, grip 5/5, neurovascular/neurosensory intact  Skin:    General: Skin is warm and dry.  Neurological:     General: No focal deficit present.     Mental Status: She is alert and oriented to person, place, and time. Mental status is at baseline.  Psychiatric:        Mood and Affect: Mood normal.        Behavior: Behavior normal.      UC Treatments / Results   Labs (all labs ordered are listed, but only abnormal results are displayed) Labs Reviewed - No data to display  EKG   Radiology DG Hand Complete Left Result Date: 05/27/2023 CLINICAL DATA:  Left hand pain for 1 week EXAM: LEFT HAND - COMPLETE 3+ VIEW COMPARISON:  None Available. FINDINGS: Normal alignment without acute osseous finding, fracture, subluxation, or dislocation. Severe degenerative osteoarthritis changes of the left first [facility] joint with joint space loss, sclerosis and bony spurring/deformity of the trapezium and first metacarpal articulation. Soft tissues unremarkable. IMPRESSION: 1. 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 2. No acute finding by plain radiography. Electronically Signed   By: Melven Stable.  Shick M.D.   On: 05/27/2023 12:54    Procedures Procedures (including critical care time)  Medications Ordered in UC Medications - No data to display  Initial Impression / Assessment and Plan / UC Course  I have reviewed the triage vital signs and the nursing notes.  Pertinent labs & imaging results that were available during my care of the patient were reviewed by me and considered in my medical decision making (see chart for details).     MDM: 1.  Left hand pain-left hand x-ray revealed above, Rx'd Celebrex 200 mg capsule: Take 1 capsule daily x 15 days; 2.  Hand tingling-Rx'd prednisone 20 mg tablet: Take 3 tablets p.o. daily x 5 days. Advised patient to take medications as directed with food to completion.  Encouraged to increase daily water intake to 64 ounces per day while taking these medications.  Advised if symptoms worsen and/or unresolved please follow-up with your PCP or here for further evaluation.  Patient discharged home, hemodynamically stable. Final Clinical Impressions(s) / UC Diagnoses   Final diagnoses:  Left hand pain  Hand tingling  Discharge Instructions      Advised patient to take medications as directed with food to completion.   Encouraged to increase daily water intake to 64 ounces per day while taking these medications.  Advised if symptoms worsen and/or unresolved please follow-up with your PCP or here for further evaluation.   ED Prescriptions     Medication Sig Dispense Auth. Provider   predniSONE (DELTASONE) 20 MG tablet Take 3 tabs PO daily x 5 days. 15 tablet Aaiden Depoy, FNP   celecoxib (CELEBREX) 200 MG capsule Take 1 capsule (200 mg total) by mouth daily for 15 days. 15 capsule Dana Debo, FNP      PDMP not reviewed this encounter.   Leonides Ramp, FNP 05/27/23 1333

## 2023-05-27 NOTE — Discharge Instructions (Addendum)
 Advised patient to take medications as directed with food to completion.  Encouraged to increase daily water intake to 64 ounces per day while taking these medications.  Advised if symptoms worsen and/or unresolved please follow-up with your PCP or here for further evaluation.
